# Patient Record
Sex: Female | Born: 2000 | Race: Black or African American | Hispanic: No | Marital: Single | Smoking: Never smoker
Health system: Southern US, Community
[De-identification: ages and names within clinical notes are randomized; demographics above are authoritative.]

---

## 2020-05-12 ENCOUNTER — Emergency Department (HOSPITAL_BASED_OUTPATIENT_CLINIC_OR_DEPARTMENT_OTHER): Payer: Self-pay

## 2020-05-12 ENCOUNTER — Emergency Department (HOSPITAL_BASED_OUTPATIENT_CLINIC_OR_DEPARTMENT_OTHER)
Admission: EM | Admit: 2020-05-12 | Discharge: 2020-05-13 | Disposition: A | Discharge status: Home / Self Care | Payer: Self-pay | Attending: Emergency Medicine | Admitting: Emergency Medicine

## 2020-05-12 ENCOUNTER — Other Ambulatory Visit: Payer: Self-pay

## 2020-05-12 ENCOUNTER — Encounter (HOSPITAL_BASED_OUTPATIENT_CLINIC_OR_DEPARTMENT_OTHER): Payer: Self-pay | Admitting: *Deleted

## 2020-05-12 DIAGNOSIS — M79605 Pain in left leg: Secondary | ICD-10-CM | POA: Insufficient documentation

## 2020-05-12 DIAGNOSIS — D649 Anemia, unspecified: Secondary | ICD-10-CM | POA: Insufficient documentation

## 2020-05-12 MED ORDER — IBUPROFEN 800 MG PO TABS
800.0000 mg | ORAL_TABLET | Freq: Once | ORAL | Status: AC
  Administered 2020-05-12: 800 mg via ORAL
  Filled 2020-05-12: qty 1

## 2020-05-12 NOTE — ED Notes (Signed)
Patient transported to Ultrasound 

## 2020-05-12 NOTE — ED Triage Notes (Signed)
Pt c/o left calf pain x 1 month , denies injury

## 2020-05-12 NOTE — ED Provider Notes (Signed)
[facility] EMERGENCY DEPARTMENT Provider Note   CSN: [id number] Arrival date & time: 05/12/20  1905   History Chief Complaint  Patient presents with  . Leg Pain    Kristen Armstrong is a 19 y.o. female.  The history is provided by the patient.  Leg Pain She complains of pain in the posterior aspect of her left leg and calf intermittently over the last month.  Pain tends to be present when she wakes up and present when she goes to sleep at night.  During the day, pain would come in a random fashion last up to an hour before resolving.  When present, pain is rated at 8/10.  Nothing makes it better, nothing makes it worse.  She had done some reading on the Internet and was worried that her low iron might have something to do with it and worried that she might have restless leg syndrome.  She started taking iron supplements 3 days ago, and there seems to have been some slight improvement.  She denies any weakness, numbness, tingling.  Pain does not go into her back.  She denies any bowel or bladder dysfunction.  She has not had similar symptoms before.  History reviewed. No pertinent past medical history.  There are no problems to display for this patient.   History reviewed. No pertinent surgical history.   OB History   No obstetric history on file.     No family history on file.  Social History   Tobacco Use  . Smoking status: Never Smoker  Vaping Use  . Vaping Use: Never used  Substance Use Topics  . Alcohol use: Yes  . Drug use: Not Currently    Home Medications Prior to Admission medications   Not on File    Allergies    Patient has no known allergies.  Review of Systems   Review of Systems  All other systems reviewed and are negative.   Physical Exam Updated Vital Signs BP 93/62 (BP Location: Right Arm)   Pulse 75   Temp 98.1 F (36.7 C) (Oral)   Resp 14   Ht 5\' 1"  (1.549 m)   Wt 58.1 kg   LMP 05/11/2020   SpO2 100%   BMI 24.19 kg/m    Physical Exam Vitals and nursing note reviewed.   19 year old female, resting comfortably and in no acute distress. Vital signs are normal. Oxygen saturation is 100%, which is normal. Head is normocephalic and atraumatic. PERRLA, EOMI. Oropharynx is clear. Neck is nontender and supple without adenopathy or JVD. Back is nontender and there is no CVA tenderness.  There is no CVA tenderness. Lungs are clear without rales, wheezes, or rhonchi. Chest is nontender. Heart has regular rate and rhythm without murmur. Abdomen is soft, flat, nontender without masses or hepatosplenomegaly and peristalsis is normoactive. Extremities have no cyanosis or edema, full range of motion is present.  Calf circumference and thigh circumference is symmetric. Skin is warm and dry without rash. Neurologic: Mental status is normal, cranial nerves are intact, there are no motor or sensory deficits.  Strength is 5/5 in hip flexors, hip extensors, hip abduction, hip abduction, knee flexion, knee extension, dorsiflexion, plantarflexion.  Careful sensory exam of the legs shows no deficits.  ED Results / Procedures / Treatments   Labs (all labs ordered are listed, but only abnormal results are displayed) Labs Reviewed  BASIC METABOLIC PANEL - Abnormal; Notable for the following components:      Result Value  Calcium 8.7 (*)    All other components within normal limits  CBC WITH DIFFERENTIAL/PLATELET - Abnormal; Notable for the following components:   Hemoglobin 11.3 (*)    HCT 35.8 (*)    All other components within normal limits    Radiology US Venous Img Lower Unilateral Left  Result Date: 05/12/2020 CLINICAL DATA:  Leg pain EXAM: LEFT LOWER EXTREMITY VENOUS DOPPLER ULTRASOUND TECHNIQUE: Gray-scale sonography with compression, as well as color and duplex ultrasound, were performed to evaluate the deep venous system(s) from the level of the common femoral vein through the popliteal and proximal calf veins.  COMPARISON:  None. FINDINGS: VENOUS Normal compressibility of the common femoral, superficial femoral, and popliteal veins, as well as the visualized calf veins. Visualized portions of profunda femoral vein and great saphenous vein unremarkable. No filling defects to suggest DVT on grayscale or color Doppler imaging. Doppler waveforms show normal direction of venous flow, normal respiratory plasticity and response to augmentation. Limited views of the contralateral common femoral vein are unremarkable. OTHER None. Limitations: none IMPRESSION: Negative. Electronically Signed   By: Katherine Mantle M.D.   On: 05/12/2020 20:06    Procedures Procedures  Medications Ordered in ED Medications  ibuprofen (ADVIL) tablet 800 mg (has no administration in time range)    ED Course  I have reviewed the triage vital signs and the nursing notes.  Pertinent labs & imaging results that were available during my care of the patient were reviewed by me and considered in my medical decision making (see chart for details).  MDM Rules/Calculators/A&P Left leg pain of uncertain cause.  No concerning findings on physical exam.  Venous Doppler been obtained prior to my seeing the patient and is negative.  I am wondering if there might be an electrolyte disturbance, so we will check electrolytes.  Clinical exam not suggestive of sciatica.  Old records are reviewed, and she has no relevant past visits.  Labs do show a mild anemia.  Potassium is technically normal, but on the lower end of normal.  Relative hypokalemia may be contributing to some muscle spasms.  She is given a dose of potassium and is advised to eat more foods that are high in potassium.  She is advised to take over-the-counter NSAIDs and anti-inflammatory doses, use acetaminophen as needed for additional pain relief.  Return precautions discussed.  Final Clinical Impression(s) / ED Diagnoses Final diagnoses:  Left leg pain  Normochromic normocytic  anemia    Rx / DC Orders ED Discharge Orders    None       Dione Booze, MD 05/13/20 [id number]

## 2020-05-13 LAB — CBC WITH DIFFERENTIAL/PLATELET
Abs Immature Granulocytes: 0.04 10*3/uL (ref 0.00–0.07)
Basophils Absolute: 0 10*3/uL (ref 0.0–0.1)
Basophils Relative: 0 %
Eosinophils Absolute: 0.1 10*3/uL (ref 0.0–0.5)
Eosinophils Relative: 2 %
HCT: 35.8 % — ABNORMAL LOW (ref 36.0–46.0)
Hemoglobin: 11.3 g/dL — ABNORMAL LOW (ref 12.0–15.0)
Immature Granulocytes: 1 %
Lymphocytes Relative: 36 %
Lymphs Abs: 2.1 10*3/uL (ref 0.7–4.0)
MCH: 27.3 pg (ref 26.0–34.0)
MCHC: 31.6 g/dL (ref 30.0–36.0)
MCV: 86.5 fL (ref 80.0–100.0)
Monocytes Absolute: 0.4 10*3/uL (ref 0.1–1.0)
Monocytes Relative: 7 %
Neutro Abs: 3.1 10*3/uL (ref 1.7–7.7)
Neutrophils Relative %: 54 %
Platelets: 293 10*3/uL (ref 150–400)
RBC: 4.14 MIL/uL (ref 3.87–5.11)
RDW: 14 % (ref 11.5–15.5)
WBC: 5.9 10*3/uL (ref 4.0–10.5)
nRBC: 0 % (ref 0.0–0.2)

## 2020-05-13 LAB — BASIC METABOLIC PANEL
Anion gap: 9 (ref 5–15)
BUN: 10 mg/dL (ref 6–20)
CO2: 25 mmol/L (ref 22–32)
Calcium: 8.7 mg/dL — ABNORMAL LOW (ref 8.9–10.3)
Chloride: 105 mmol/L (ref 98–111)
Creatinine, Ser: 0.6 mg/dL (ref 0.44–1.00)
GFR calc Af Amer: >60 mL/min (ref >60)
GFR calc non Af Amer: >60 mL/min (ref >60)
Glucose, Bld: 97 mg/dL (ref 70–99)
Potassium: 3.6 mmol/L (ref 3.5–5.1)
Sodium: 139 mmol/L (ref 135–145)

## 2020-05-13 MED ORDER — POTASSIUM CHLORIDE CRYS ER 20 MEQ PO TBCR
40.0000 meq | EXTENDED_RELEASE_TABLET | Freq: Once | ORAL | Status: AC
  Administered 2020-05-13: 40 meq via ORAL
  Filled 2020-05-13: qty 2

## 2020-05-13 NOTE — Discharge Instructions (Addendum)
The cause for the pain in your leg is not clear.  There is no sign of a blood clot.  Your potassium is borderline, and low potassium might explain some of your symptoms.  Try to eat foods high in potassium.  Some of these foods are listed in the hypokalemia instructions given to you.  Take either naproxen or ibuprofen in the anti-inflammatory dose.  That would be two naproxen tablets twice a day, or three ibuprofen tablets four times a day.  You may take acetaminophen as needed for additional pain relief.

## 2020-07-23 ENCOUNTER — Other Ambulatory Visit: Payer: Self-pay

## 2020-07-23 ENCOUNTER — Emergency Department (HOSPITAL_BASED_OUTPATIENT_CLINIC_OR_DEPARTMENT_OTHER)
Admission: EM | Admit: 2020-07-23 | Discharge: 2020-07-23 | Disposition: A | Discharge status: Home / Self Care | Payer: Self-pay | Attending: Emergency Medicine | Admitting: Emergency Medicine

## 2020-07-23 ENCOUNTER — Encounter (HOSPITAL_BASED_OUTPATIENT_CLINIC_OR_DEPARTMENT_OTHER): Payer: Self-pay | Admitting: *Deleted

## 2020-07-23 DIAGNOSIS — Z5321 Procedure and treatment not carried out due to patient leaving prior to being seen by health care provider: Secondary | ICD-10-CM | POA: Insufficient documentation

## 2020-07-23 DIAGNOSIS — N63 Unspecified lump in unspecified breast: Secondary | ICD-10-CM | POA: Insufficient documentation

## 2020-07-23 NOTE — ED Triage Notes (Signed)
c/o breast lump x 1 day

## 2020-07-23 NOTE — ED Notes (Signed)
Called x 3 , pt left department

## 2021-08-10 IMAGING — US US EXTREM LOW VENOUS*L*
1 series · 14 of 24 positions shown · non-contrast
Comparison: None.

CLINICAL DATA: Leg pain

EXAM:
LEFT LOWER EXTREMITY VENOUS DOPPLER ULTRASOUND
TECHNIQUE: Gray-scale sonography with compression, as well as color and duplex
ultrasound, were performed to evaluate the deep venous system(s)
from the level of the common femoral vein through the popliteal and
proximal calf veins.

[Series 1: us extrem low venous*left* · 14 of 28 slices shown]
[im 1/28]
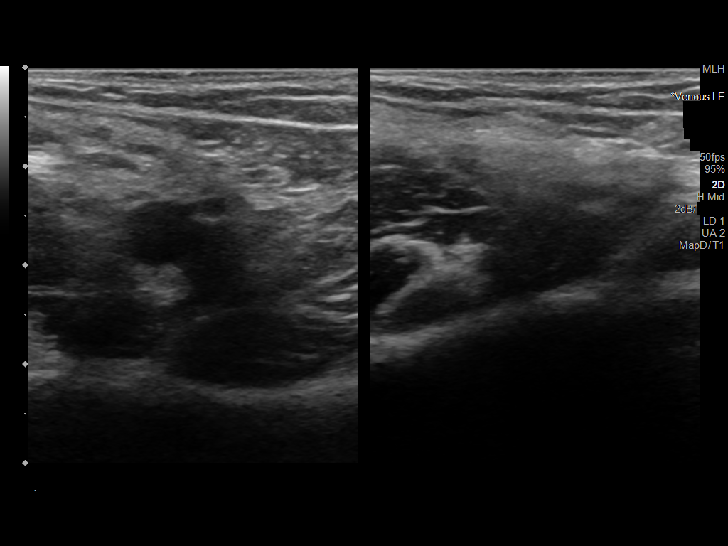
[im 3/28]
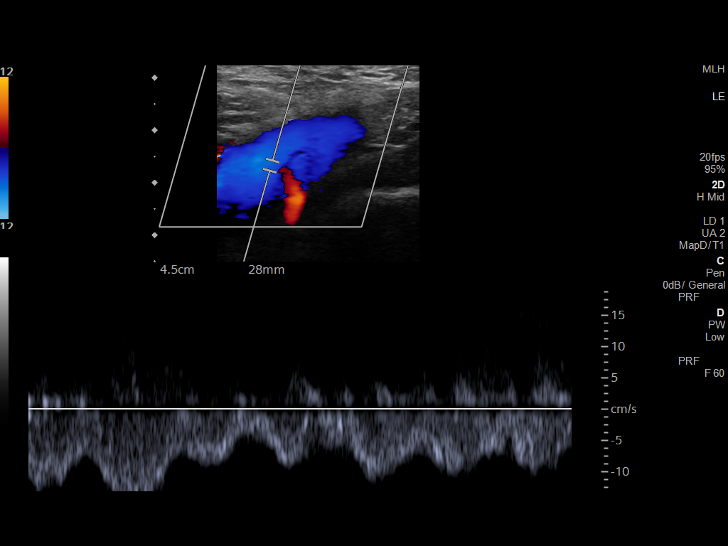
[im 5/28]
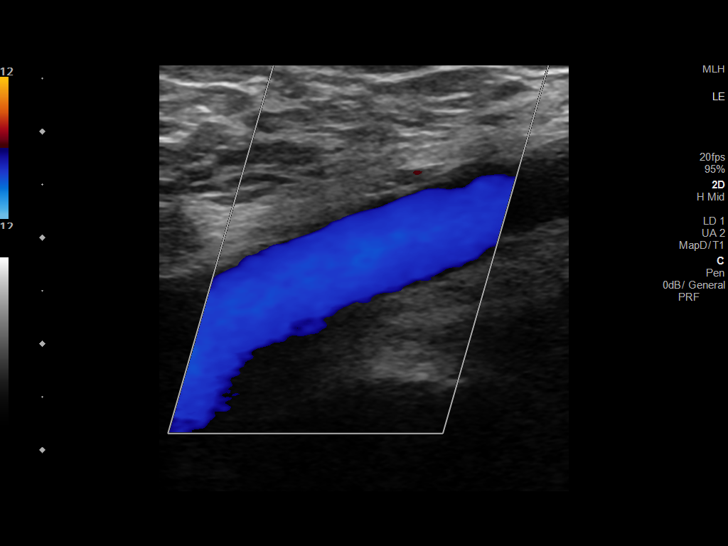
[im 8/28]
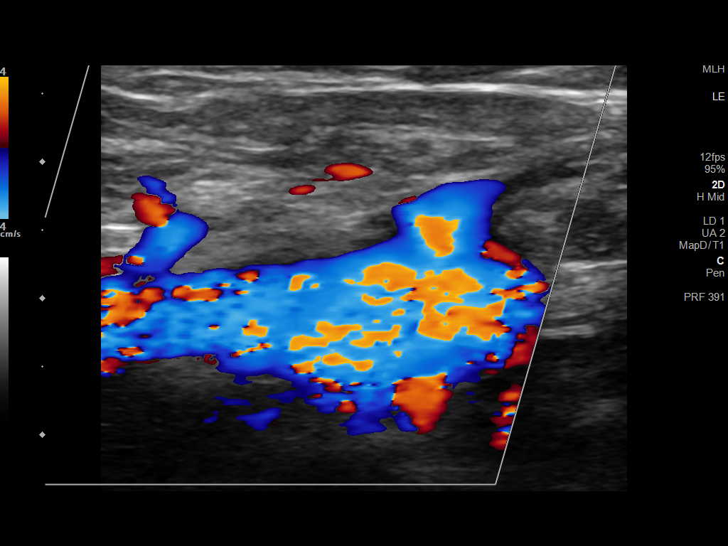
[im 9/28]
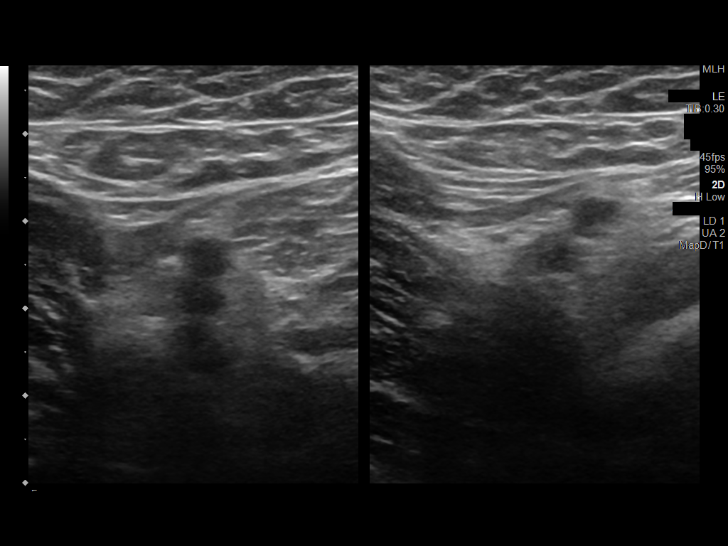
[im 11/28]
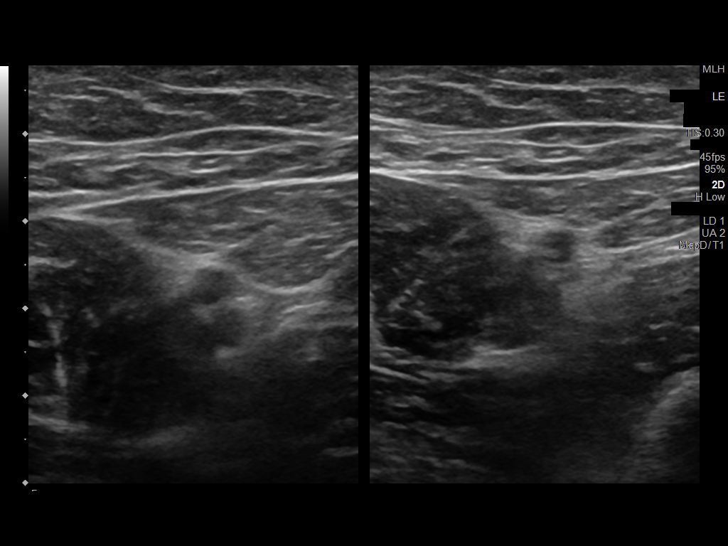
[im 13/28]
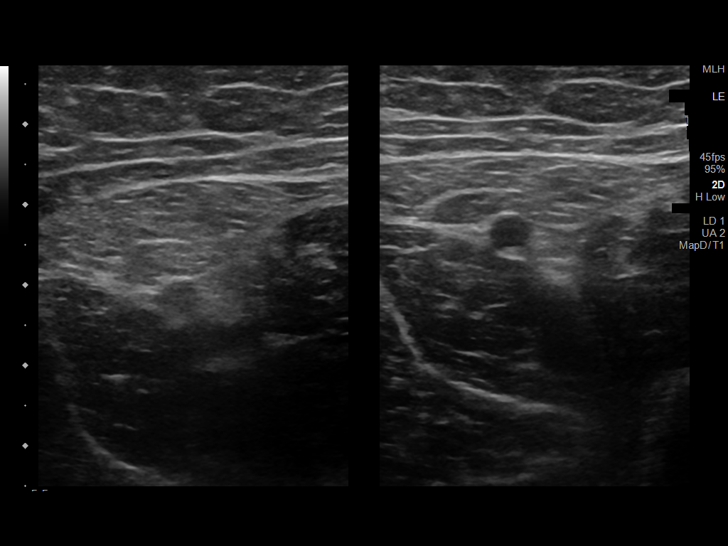
[im 15/28]
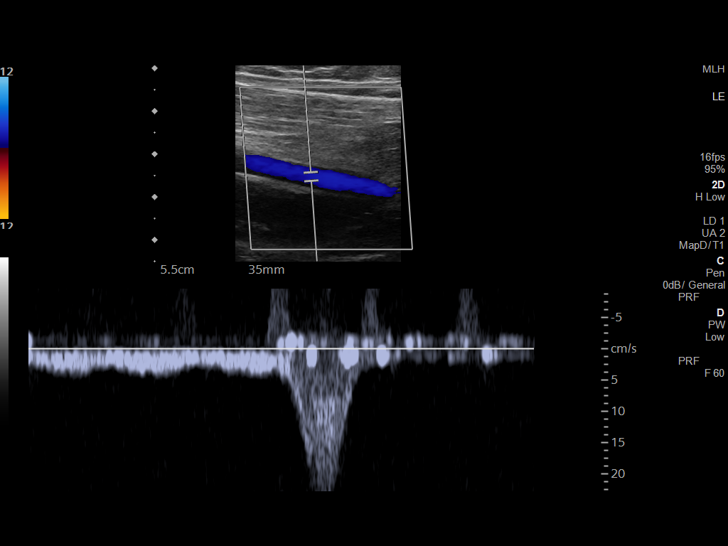
[im 17/28]
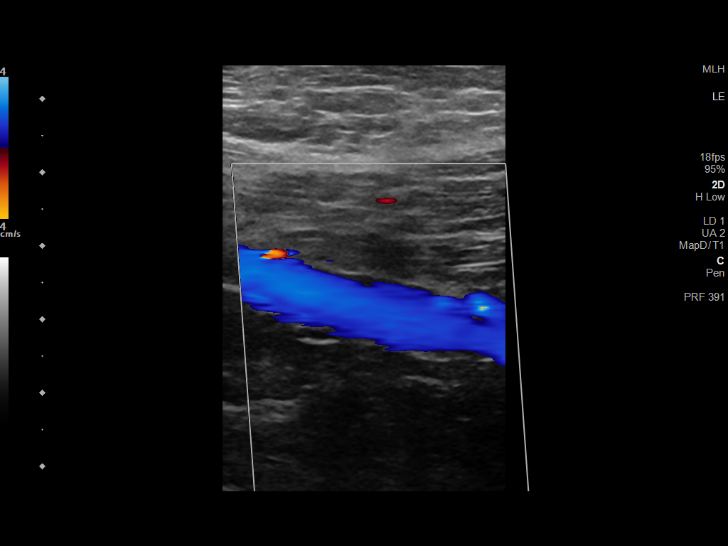
[im 19/28]
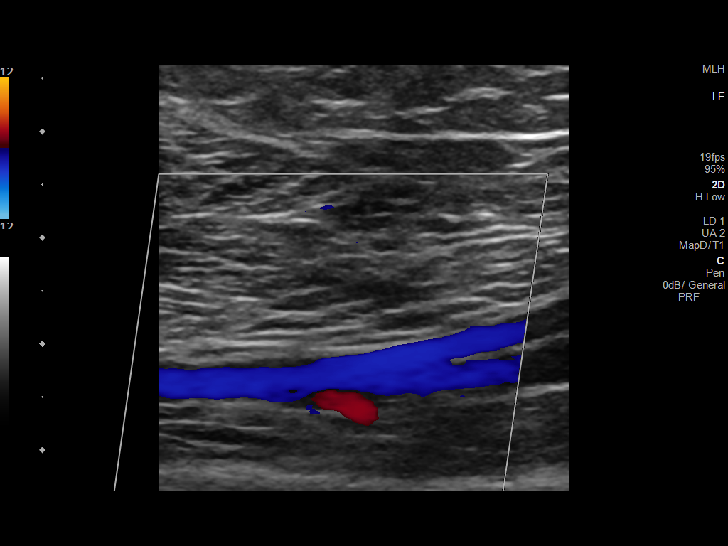
[im 22/28]
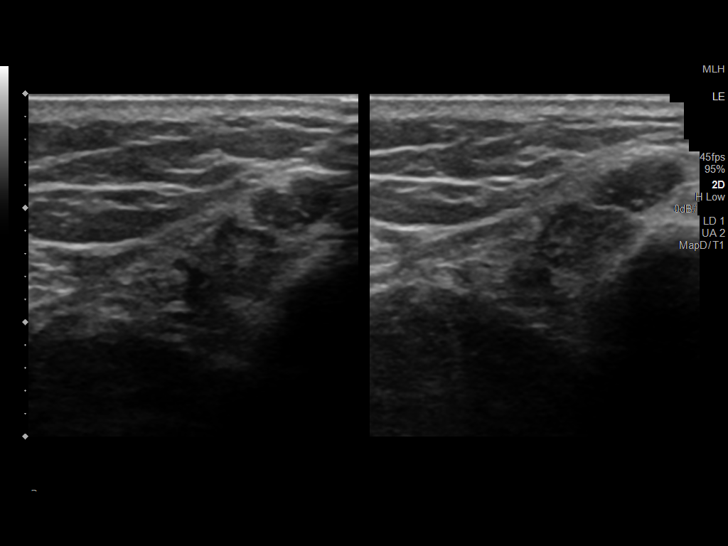
[im 23/28]
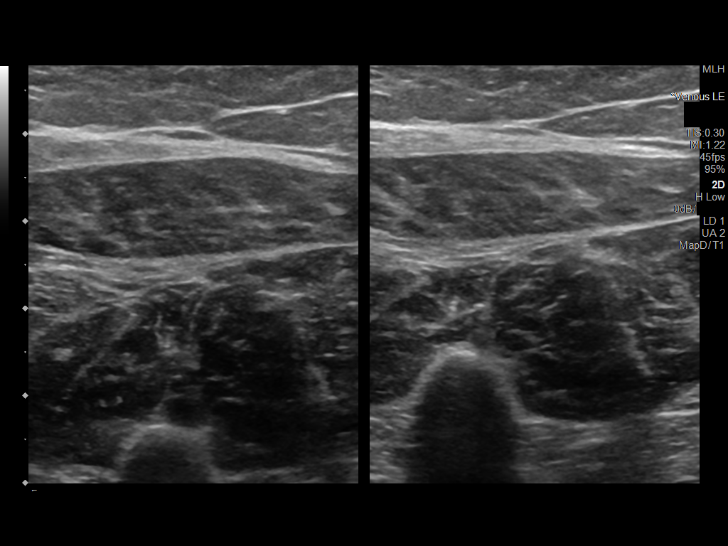
[im 25/28]
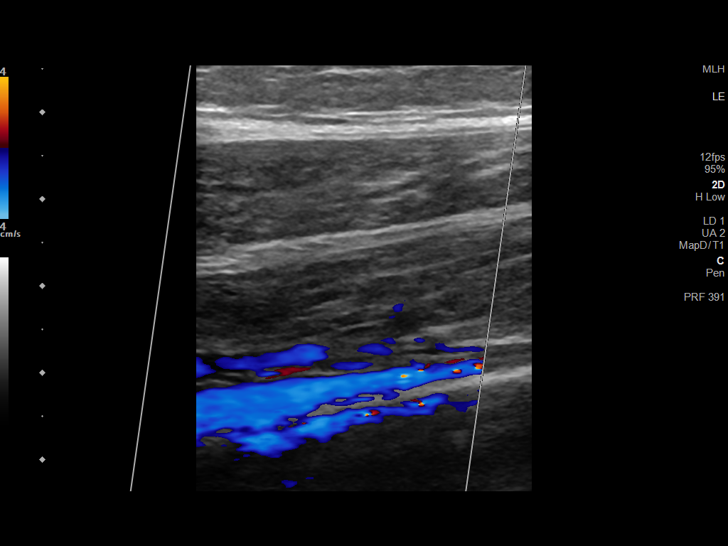
[im 28/28]
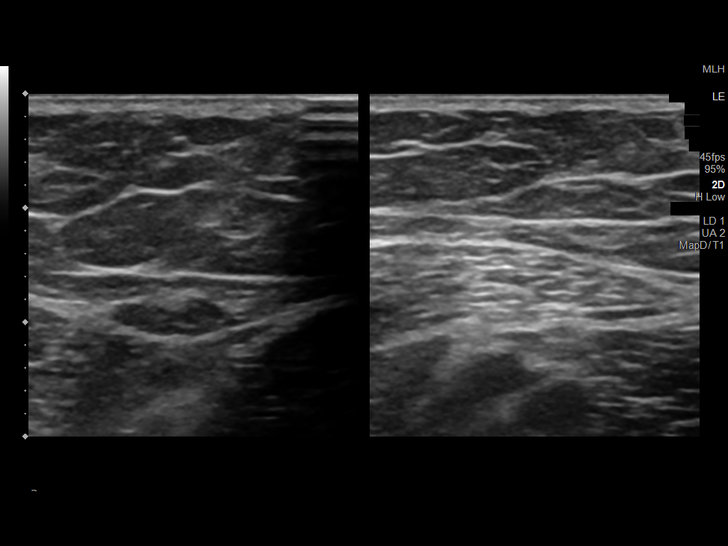

[14 of 24 positions shown; findings below may reference images not displayed]

FINDINGS: VENOUS

Normal compressibility of the common femoral, superficial femoral,
and popliteal veins, as well as the visualized calf veins.
Visualized portions of profunda femoral vein and great saphenous
vein unremarkable. No filling defects to suggest DVT on grayscale or
color Doppler imaging. Doppler waveforms show normal direction of
venous flow, normal respiratory plasticity and response to
augmentation.

Limited views of the contralateral common femoral vein are
unremarkable.

OTHER

None.

Limitations: none
IMPRESSION: Negative.
